# Patient Record
Sex: Male | Born: 1980
Health system: Southern US, Community
[De-identification: ages and names within clinical notes are randomized; demographics above are authoritative.]

---

## 2017-01-05 DIAGNOSIS — M21371 Foot drop, right foot: Secondary | ICD-10-CM | POA: Diagnosis not present

## 2017-01-05 DIAGNOSIS — M5410 Radiculopathy, site unspecified: Secondary | ICD-10-CM | POA: Diagnosis not present

## 2017-02-03 ENCOUNTER — Other Ambulatory Visit: Payer: Self-pay | Admitting: Physician Assistant

## 2017-02-03 DIAGNOSIS — M5416 Radiculopathy, lumbar region: Secondary | ICD-10-CM

## 2017-02-03 DIAGNOSIS — Z77018 Contact with and (suspected) exposure to other hazardous metals: Secondary | ICD-10-CM

## 2017-02-16 ENCOUNTER — Ambulatory Visit
Admission: RE | Admit: 2017-02-16 | Discharge: 2017-02-16 | Disposition: A | Discharge status: Home / Self Care | Payer: Commercial Managed Care - HMO | Source: Ambulatory Visit | Attending: Physician Assistant | Admitting: Physician Assistant

## 2017-02-16 DIAGNOSIS — Z77018 Contact with and (suspected) exposure to other hazardous metals: Secondary | ICD-10-CM

## 2017-02-16 DIAGNOSIS — M48061 Spinal stenosis, lumbar region without neurogenic claudication: Secondary | ICD-10-CM | POA: Diagnosis not present

## 2017-02-16 DIAGNOSIS — M5416 Radiculopathy, lumbar region: Secondary | ICD-10-CM

## 2017-02-24 DIAGNOSIS — M5416 Radiculopathy, lumbar region: Secondary | ICD-10-CM | POA: Diagnosis not present

## 2017-02-24 DIAGNOSIS — M21371 Foot drop, right foot: Secondary | ICD-10-CM | POA: Diagnosis not present

## 2017-03-04 DIAGNOSIS — M5116 Intervertebral disc disorders with radiculopathy, lumbar region: Secondary | ICD-10-CM | POA: Diagnosis not present

## 2017-03-04 DIAGNOSIS — M5126 Other intervertebral disc displacement, lumbar region: Secondary | ICD-10-CM | POA: Diagnosis not present

## 2018-01-28 IMAGING — MR MR LUMBAR SPINE W/O CM
5 series · 44 of 48 positions shown · non-contrast
Comparison: None.

CLINICAL DATA: Low back pain for 2 months.

EXAM:
MRI LUMBAR SPINE WITHOUT CONTRAST
TECHNIQUE: Multiplanar, multisequence MR imaging of the lumbar spine was
performed. No intravenous contrast was administered.

[Series 3: tirm sag · sagittal · 4.0mm · 0.55mm/px · 6 of 13 slices shown]
[im 1/13]
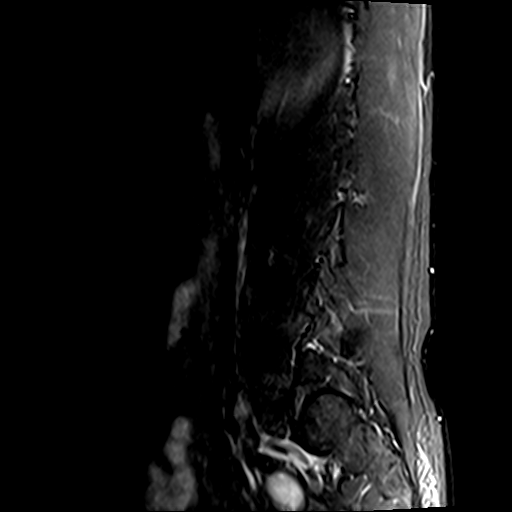
[im 3/13]
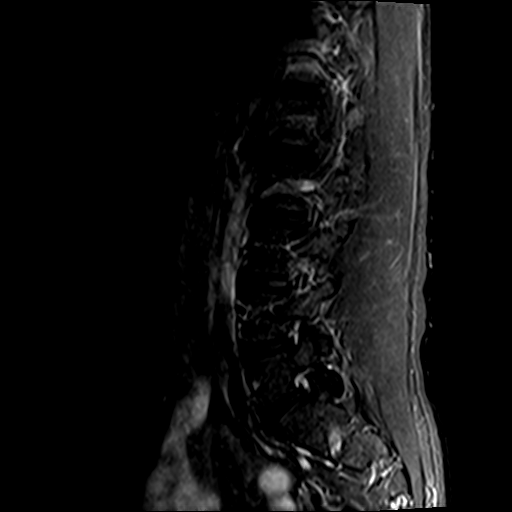
[im 5/13]
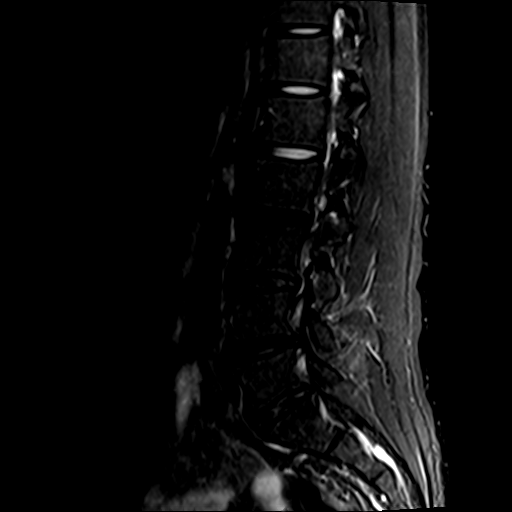
[im 8/13]
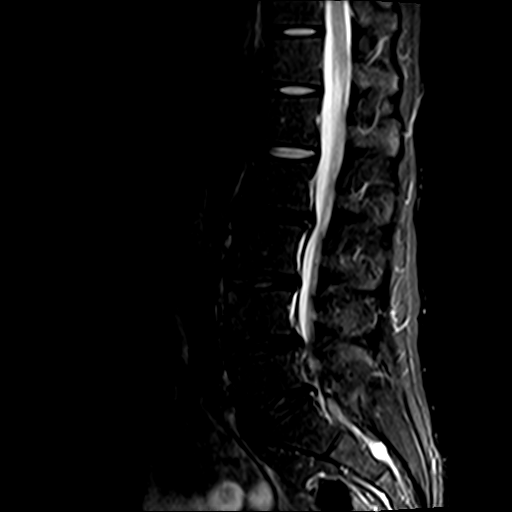
[im 10/13]
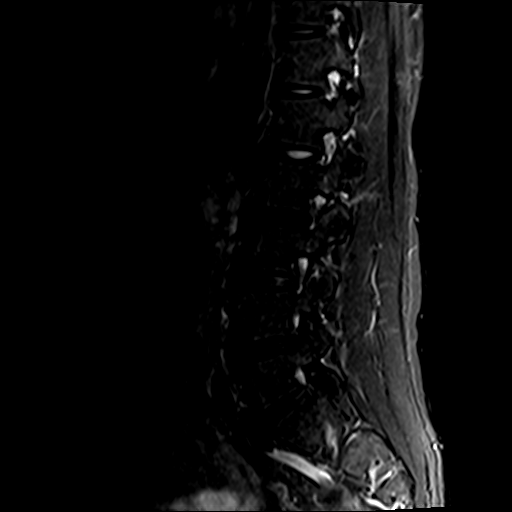
[im 13/13]
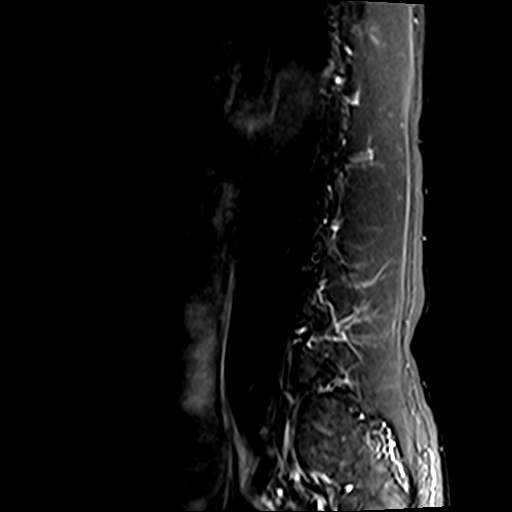

[Series 4: T2 · sagittal · 4.0mm · 0.88mm/px · 6 of 13 slices shown (1 of 2)]
[im 1/13]
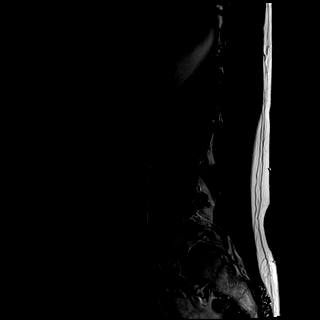
[im 3/13]
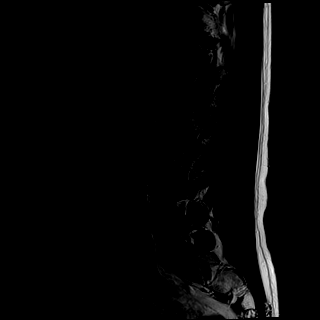
[im 5/13]
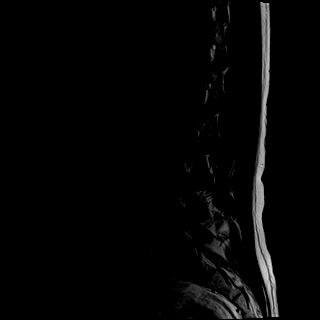
[im 8/13]
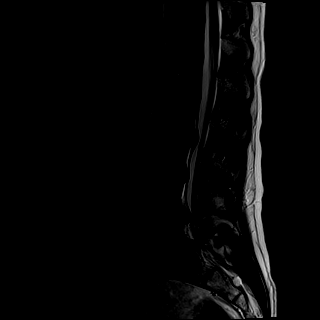
[im 10/13]
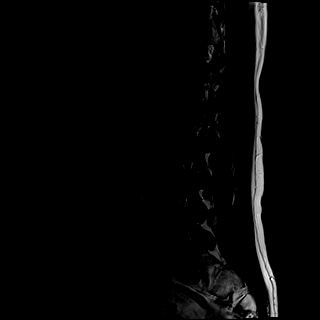
[im 13/13]
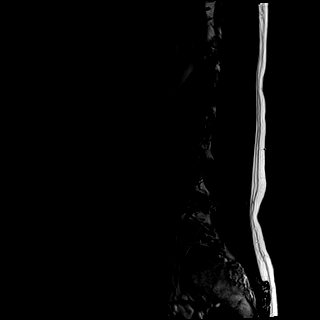

[Series 5: T1 · sagittal · 4.0mm · 0.88mm/px · 6 of 13 slices shown (1 of 2)]
[im 1/13]
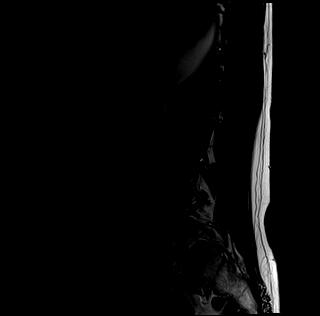
[im 3/13]
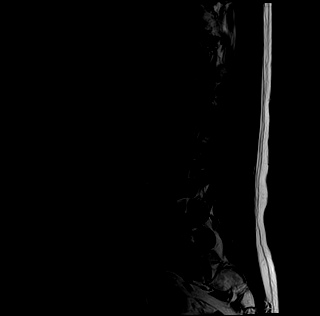
[im 5/13]
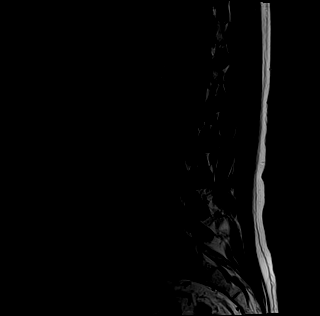
[im 8/13]
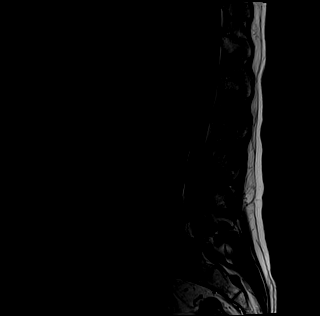
[im 10/13]
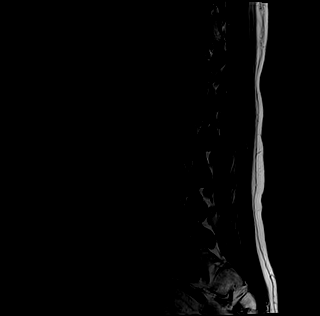
[im 13/13]
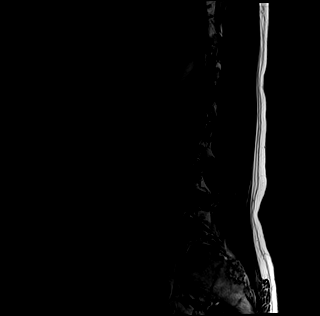

[Series 6: T1 · axial · 4.0mm · 0.78mm/px · z∈[-116,+78]mm · 11 of 33 slices shown (2 of 2)]
[im 1/33]
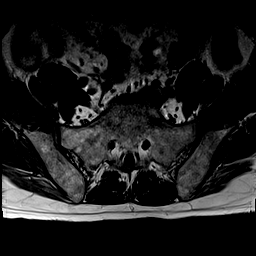
[im 3/33]
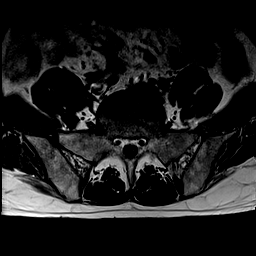
[im 5/33]
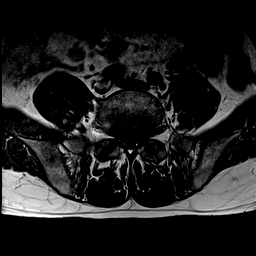
[im 7/33]
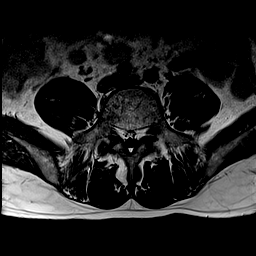
[im 10/33]
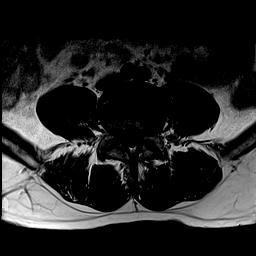
[im 14/33]
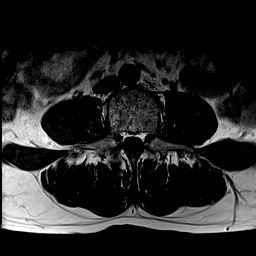
[im 17/33]
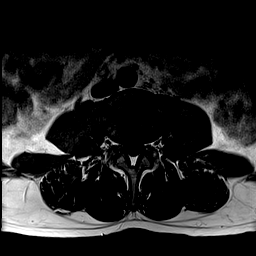
[im 19/33]
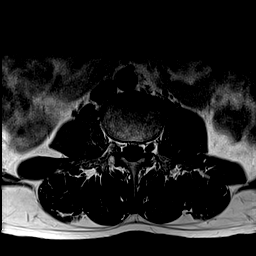
[im 23/33]
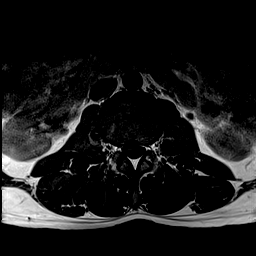
[im 28/33]
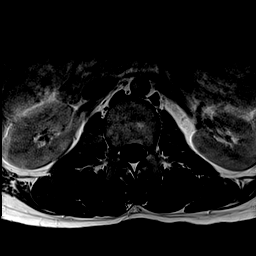
[im 33/33]
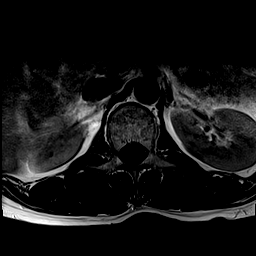

[Series 7: T2 · axial · 4.0mm · 0.78mm/px · z∈[-116,+78]mm · 15 of 33 slices shown (2 of 2)]
[im 1/33]
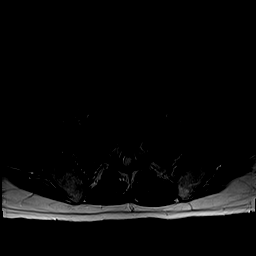
[im 3/33]
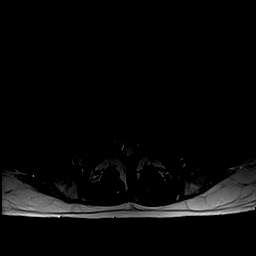
[im 5/33]
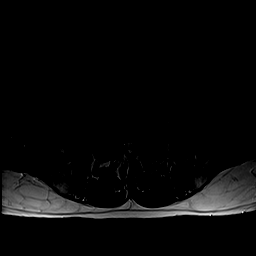
[im 7/33]
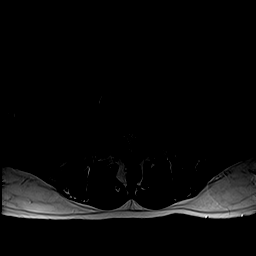
[im 10/33]
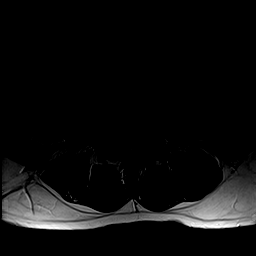
[im 12/33]
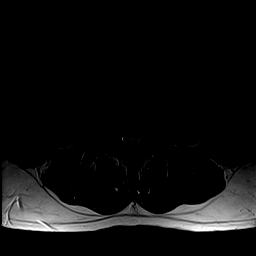
[im 14/33]
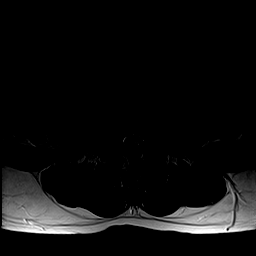
[im 17/33]
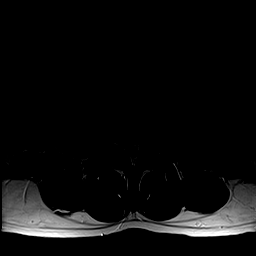
[im 19/33]
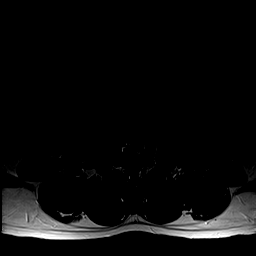
[im 21/33]
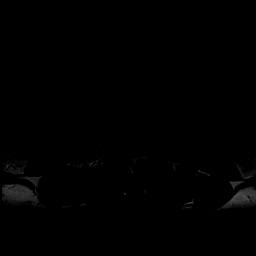
[im 23/33]
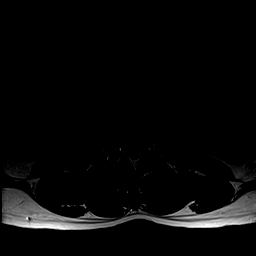
[im 26/33]
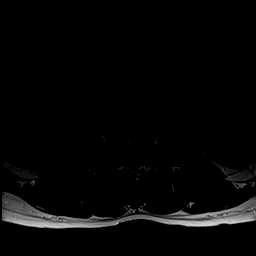
[im 28/33]
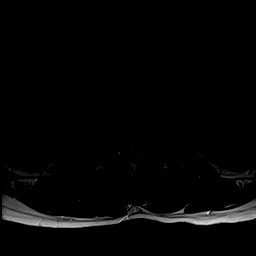
[im 30/33]
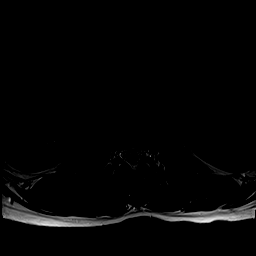
[im 33/33]
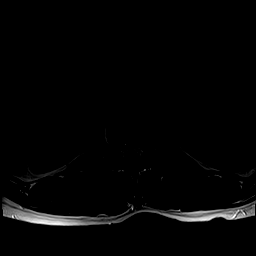

[44 of 48 positions shown; findings below may reference images not displayed]

FINDINGS: Segmentation:  Standard

Alignment:  Anatomic

Vertebrae:  No worrisome osseous lesion

Conus medullaris: Extends to the L1 level and appears normal.

Paraspinal and other soft tissues: Unremarkable

Disc levels:

L1-L2:  Normal.

L2-L3: Disc space narrowing. Central extrusion. Mild to moderate
stenosis with LEFT greater than RIGHT L3 nerve root impingement.

L3-L4: Central and rightward extrusion. Mild facet arthropathy. Mild
stenosis. RIGHT L4 nerve root impingement.

L4-L5: Large central extrusion. Caudally migrated free fragment on
the RIGHT. Posterior element hypertrophy. Severe stenosis. BILATERAL
L5 nerve root impingement.

L5-S1: Central and leftward extrusion. Posterior element
hypertrophy. Mild to moderate stenosis. LEFT greater than RIGHT S1
nerve root impingement.
IMPRESSION: The dominant RIGHT-sided abnormality is at L4-5, where central and
rightward extrusion, and caudally migrated free fragment, in
conjunction with posterior element hypertrophy, result in severe
stenosis and RIGHT greater than LEFT L5 nerve root impingement.

Potentially symptomatic RIGHT-sided neural impingement is also
present at L3-L4 with a central and rightward extrusion affecting
the RIGHT L4 nerve root.

Mild to moderate stenosis at L2-3 and L5-S1 related to disc
pathology, and posterior element hypertrophy although the
predominant neural impingement at these levels is to the LEFT.

## 2019-08-15 ENCOUNTER — Other Ambulatory Visit: Payer: Self-pay | Admitting: *Deleted

## 2019-08-15 DIAGNOSIS — Z20822 Contact with and (suspected) exposure to covid-19: Secondary | ICD-10-CM

## 2019-08-17 LAB — NOVEL CORONAVIRUS, NAA: SARS-CoV-2, NAA: DETECTED — AB

## 2020-01-12 ENCOUNTER — Ambulatory Visit: Payer: Self-pay | Attending: Internal Medicine

## 2020-01-12 DIAGNOSIS — Z23 Encounter for immunization: Secondary | ICD-10-CM

## 2020-01-12 NOTE — Progress Notes (Signed)
   [facility]ination [facility]  Name:  Luis Powell    MRN: [id number] DOB: 09/16/1981  01/12/2020  [name] was observed post Covid-19 immunization for 15 minutes without incident. He was provided with Vaccine Information Sheet and instruction to access the V-Safe system.   [name] was instructed to call 911 with any severe reactions post vaccine: Marland Kitchen Difficulty breathing  . Swelling of face and throat  . A fast heartbeat  . A bad rash all over body  . Dizziness and weakness   Immunizations Administered    Name Date Dose VIS Date Route   Moderna COVID-19 Vaccine 01/12/2020 12:14 PM 0.5 mL 09/12/2019 Intramuscular   Manufacturer: Moderna   Lot: [id number]   NDC: 44458-483-50

## 2020-02-14 ENCOUNTER — Ambulatory Visit: Payer: Self-pay | Attending: Internal Medicine

## 2020-02-14 DIAGNOSIS — Z23 Encounter for immunization: Secondary | ICD-10-CM

## 2020-02-14 NOTE — Progress Notes (Signed)
   [facility]ovid[facility] Vaccination [facility]  Name:  Luis Powell    MRN: [id number] DOB: October 03, 1981  02/14/2020  [name] was observed post Covid-19 immunization for 15 minutes without incident. He was provided with Vaccine Information Sheet and instruction to access the V-Safe system.   [name] was instructed to call 911 with any severe reactions post vaccine: Marland Kitchen Difficulty breathing  . Swelling of face and throat  . A fast heartbeat  . A bad rash all over body  . Dizziness and weakness   Immunizations Administered    Name Date Dose VIS Date Route   Moderna COVID-19 Vaccine 02/14/2020 12:09 PM 0.5 mL 09/2019 Intramuscular   Manufacturer: Moderna   Lot: [id number]   NDC: 45997-741-42

## 2020-11-06 ENCOUNTER — Other Ambulatory Visit: Payer: Commercial Managed Care - HMO

## 2022-05-20 DIAGNOSIS — Z0000 Encounter for general adult medical examination without abnormal findings: Secondary | ICD-10-CM | POA: Diagnosis not present

## 2022-05-20 DIAGNOSIS — E785 Hyperlipidemia, unspecified: Secondary | ICD-10-CM | POA: Diagnosis not present

## 2023-07-12 DIAGNOSIS — Z0000 Encounter for general adult medical examination without abnormal findings: Secondary | ICD-10-CM | POA: Diagnosis not present

## 2023-07-12 DIAGNOSIS — E785 Hyperlipidemia, unspecified: Secondary | ICD-10-CM | POA: Diagnosis not present

## 2023-09-23 DIAGNOSIS — Z23 Encounter for immunization: Secondary | ICD-10-CM | POA: Diagnosis not present

## 2024-01-18 DIAGNOSIS — L649 Androgenic alopecia, unspecified: Secondary | ICD-10-CM | POA: Diagnosis not present

## 2024-05-26 DIAGNOSIS — B029 Zoster without complications: Secondary | ICD-10-CM | POA: Diagnosis not present
